# Patient Record
Sex: Male | Born: 1985 | Race: Black or African American | Hispanic: No | Marital: Single | Smoking: Never smoker
Health system: Southern US, Community
[De-identification: ages and names within clinical notes are randomized; demographics above are authoritative.]

---

## 2003-03-11 ENCOUNTER — Emergency Department (HOSPITAL_COMMUNITY): Admission: EM | Admit: 2003-03-11 | Discharge: 2003-03-12 | Payer: Self-pay | Admitting: Emergency Medicine

## 2003-03-21 ENCOUNTER — Emergency Department (HOSPITAL_COMMUNITY): Admission: EM | Admit: 2003-03-21 | Discharge: 2003-03-21 | Payer: Self-pay

## 2007-08-22 ENCOUNTER — Emergency Department (HOSPITAL_COMMUNITY): Admission: EM | Admit: 2007-08-22 | Discharge: 2007-08-22 | Payer: Self-pay | Admitting: Family Medicine

## 2012-11-13 ENCOUNTER — Encounter (HOSPITAL_COMMUNITY): Payer: Self-pay | Admitting: Emergency Medicine

## 2012-11-13 ENCOUNTER — Emergency Department (HOSPITAL_COMMUNITY)
Admission: EM | Admit: 2012-11-13 | Discharge: 2012-11-13 | Disposition: A | Discharge status: Home / Self Care | Payer: Commercial Managed Care - PPO | Attending: Emergency Medicine | Admitting: Emergency Medicine

## 2012-11-13 DIAGNOSIS — Z0000 Encounter for general adult medical examination without abnormal findings: Secondary | ICD-10-CM

## 2012-11-13 DIAGNOSIS — Z79899 Other long term (current) drug therapy: Secondary | ICD-10-CM | POA: Insufficient documentation

## 2012-11-13 NOTE — ED Notes (Signed)
Needs clearance note for physical for [employer]

## 2012-11-13 NOTE — ED Provider Notes (Signed)
CSN: [id number]     Arrival date & time 11/13/12  0043 History   First MD Initiated Contact with Patient 11/13/12 0104     Chief Complaint  Patient presents with  . medical evaluation    (Consider location/radiation/quality/duration/timing/severity/associated sxs/prior Treatment) HPI Patient presents for 3 employment screening physical exam. He has currently no complaints. He states he has no medical history. He takes no medications regularly. Denies any allergies. Denies any vision problems. He does have a family history of diabetes. He's been screen for position with the [employer].  History reviewed. No pertinent past medical history. History reviewed. No pertinent past surgical history. No family history on file. History  Substance Use Topics  . Smoking status: Never Smoker   . Smokeless tobacco: Not on file  . Alcohol Use: Yes     Comment: occ    Review of Systems  Constitutional: Negative for fever and chills.  Respiratory: Negative for cough and shortness of breath.   Cardiovascular: Negative for chest pain and palpitations.  Gastrointestinal: Negative for nausea, vomiting and abdominal pain.  Endocrine: Negative for polydipsia, polyphagia and polyuria.  Genitourinary: Negative for dysuria.  Musculoskeletal: Negative for back pain.  Skin: Negative for rash.  Neurological: Negative for dizziness, weakness, light-headedness, numbness and headaches.  All other systems reviewed and are negative.    Allergies  Review of patient's allergies indicates no known allergies.  Home Medications   Current Outpatient Rx  Name  Route  Sig  Dispense  Refill  . loratadine (CLARITIN) 10 MG tablet   Oral   Take 10 mg by mouth daily as needed for allergies.          BP 129/80  Pulse 79  Temp(Src) 98.3 F (36.8 C) (Oral)  Resp 22  Ht 6' (1.829 m)  Wt 240 lb (108.863 kg)  BMI 32.54 kg/m2  SpO2 100% Physical Exam  Nursing note and vitals  reviewed. Constitutional: He is oriented to person, place, and time. He appears well-developed and well-nourished. No distress.  HENT:  Head: Normocephalic and atraumatic.  Mouth/Throat: Oropharynx is clear and moist.  Eyes: EOM are normal. Pupils are equal, round, and reactive to light.  Neck: Normal range of motion. Neck supple.  Cardiovascular: Normal rate and regular rhythm.  Exam reveals no gallop and no friction rub.   No murmur heard. Pulmonary/Chest: Effort normal and breath sounds normal. No respiratory distress. He has no wheezes. He has no rales.  Abdominal: Soft. Bowel sounds are normal. He exhibits no distension and no mass. There is no tenderness. There is no rebound and no guarding.  Musculoskeletal: Normal range of motion. He exhibits no edema and no tenderness.  Neurological: He is alert and oriented to person, place, and time.  Patient is alert and oriented x3 with clear, goal oriented speech. Patient has 5/5 motor in all extremities. Sensation is intact to light touch. Patient has a normal gait and walks without assistance.   Skin: Skin is warm and dry. No rash noted. No erythema.  Psychiatric: He has a normal mood and affect. His behavior is normal.    ED Course  Procedures (including critical care time) Labs Review Labs Reviewed - No data to display Imaging Review No results found.  EKG Interpretation   None       MDM   1. Periodic health assessment, general screening, adult       Loren Racer, MD 11/13/12 [phone]

## 2012-11-13 NOTE — ED Notes (Signed)
Pt A&O no c/o. Discharged to home.

## 2015-01-10 ENCOUNTER — Ambulatory Visit
Admission: RE | Admit: 2015-01-10 | Discharge: 2015-01-10 | Disposition: A | Discharge status: Home / Self Care | Payer: No Typology Code available for payment source | Source: Ambulatory Visit | Attending: Occupational Medicine | Admitting: Occupational Medicine

## 2015-01-10 ENCOUNTER — Other Ambulatory Visit: Payer: Self-pay | Admitting: Occupational Medicine

## 2015-01-10 DIAGNOSIS — Z021 Encounter for pre-employment examination: Secondary | ICD-10-CM

## 2016-08-13 IMAGING — CR DG CHEST 1V
1 series · 1 of 1 positions shown · non-contrast
Comparison: None.

CLINICAL DATA: .-employment examination.

EXAM:
CHEST 1 VIEW

[w chest pa]
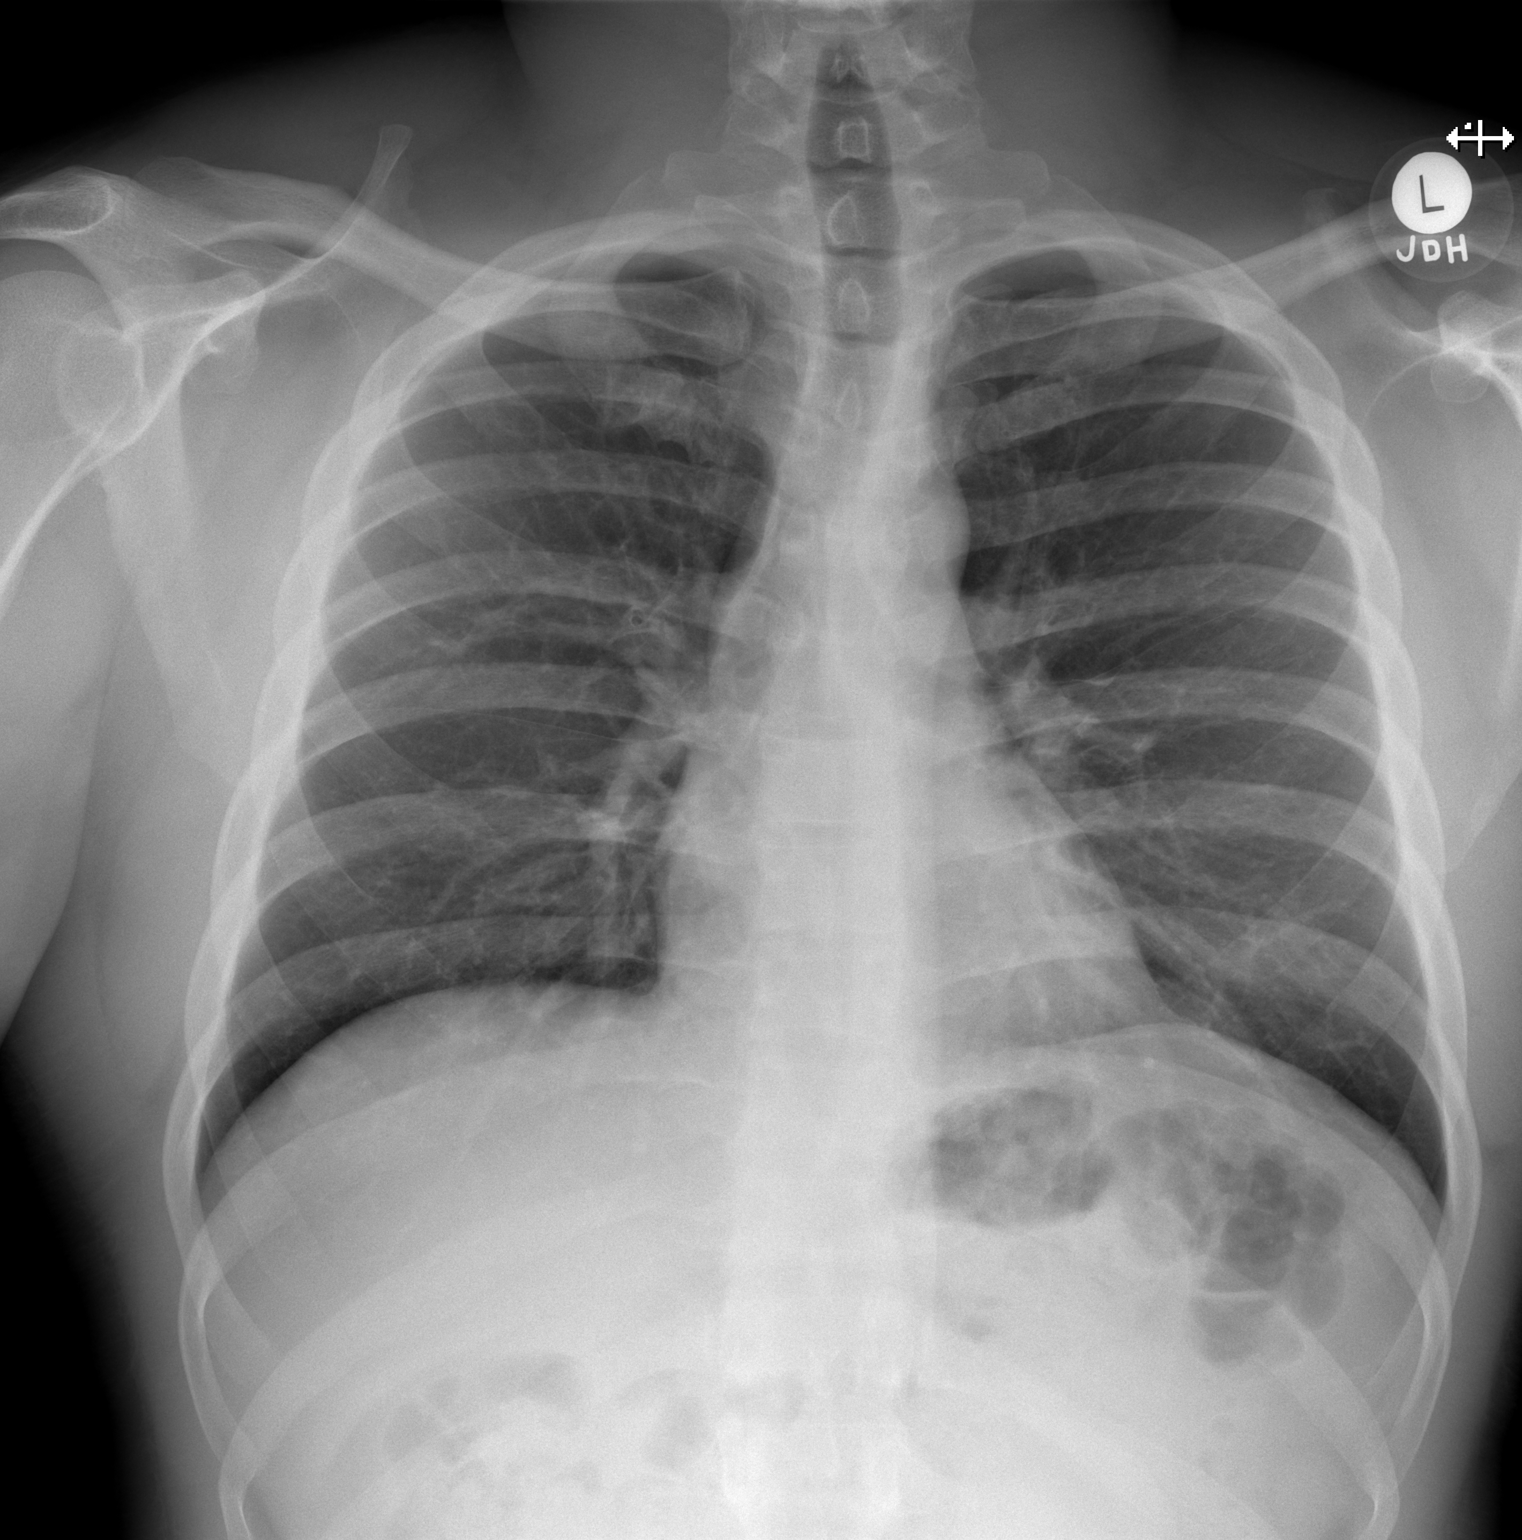

[1 of 1 positions shown; findings below may reference images not displayed]

FINDINGS: Normal heart size. Normal mediastinal contour. No pneumothorax. No
pleural effusion. Clear lungs, with no focal lung consolidation and
no pulmonary edema.
IMPRESSION: No active disease.

## 2021-11-09 ENCOUNTER — Ambulatory Visit: Payer: 59 | Admitting: Family Medicine

## 2022-06-17 ENCOUNTER — Emergency Department (HOSPITAL_COMMUNITY): Payer: 59

## 2022-06-17 ENCOUNTER — Emergency Department (HOSPITAL_COMMUNITY)
Admission: EM | Admit: 2022-06-17 | Discharge: 2022-06-17 | Disposition: A | Discharge status: Home / Self Care | Payer: 59 | Attending: Emergency Medicine | Admitting: Emergency Medicine

## 2022-06-17 ENCOUNTER — Encounter (HOSPITAL_COMMUNITY): Payer: Self-pay

## 2022-06-17 DIAGNOSIS — S39012A Strain of muscle, fascia and tendon of lower back, initial encounter: Secondary | ICD-10-CM | POA: Insufficient documentation

## 2022-06-17 DIAGNOSIS — Y990 Civilian activity done for income or pay: Secondary | ICD-10-CM | POA: Insufficient documentation

## 2022-06-17 DIAGNOSIS — X58XXXA Exposure to other specified factors, initial encounter: Secondary | ICD-10-CM | POA: Insufficient documentation

## 2022-06-17 DIAGNOSIS — M545 Low back pain, unspecified: Secondary | ICD-10-CM

## 2022-06-17 DIAGNOSIS — T148XXA Other injury of unspecified body region, initial encounter: Secondary | ICD-10-CM

## 2022-06-17 MED ORDER — PREDNISONE 20 MG PO TABS: 40.0000 mg | ORAL_TABLET | Freq: Every day | ORAL | 0 refills | Status: AC

## 2022-06-17 MED ORDER — METHOCARBAMOL 500 MG PO TABS: 500.0000 mg | ORAL_TABLET | Freq: Two times a day (BID) | ORAL | 0 refills | Status: AC

## 2022-06-17 NOTE — Discharge Instructions (Signed)
I have prescribed muscle relaxers for your pain, please do not drink or drive while taking this medications as it can make you drowsy.   I have also prescribed steroids, be aware this medication can cause insomnia, appetite changes.    Please follow-up with PCP in 1 week for reevaluation of your symptoms.If you experience any bowel or bladder incontinence, fever, worsening in your symptoms please return to the ED.  

## 2022-06-17 NOTE — ED Provider Notes (Signed)
Marco Lewis Provider Note   CSN: [id number] Arrival date & time: 06/17/22  1412     History  Chief Complaint  Patient presents with   Back Pain    Marco Lewis is a 37 y.o. male.  37 y.o male with a PMH presents to the ED with a chief complaint of back pain which has been ongoing for a few days.  Patient describes a dull aching pain to his midline lumbar spine, exacerbated with any type of movement.  He does report having an injury back in October, it states that he feels that he reinjured his back a few days ago while at work.  He does wear a work belt, however feels that this likely exacerbated his pain.  He did see an orthopedist a couple of days ago who prescribed him meloxicam which she has not picked up from the pharmacy.  He denies any bowel or bladder complaints, fever, history of cancer.  The history is provided by the patient and medical records.  Back Pain Location:  Lumbar spine Quality:  Aching Radiates to:  Does not radiate Associated symptoms: no fever        Home Medications Prior to Admission medications   Medication Sig Start Date End Date Taking? Authorizing Provider  methocarbamol (ROBAXIN) 500 MG tablet Take 1 tablet (500 mg total) by mouth 2 (two) times daily for 7 days. 06/17/22 06/24/22 Yes Allyn Bartelson, Leonie Douglas, PA-C  predniSONE (DELTASONE) 20 MG tablet Take 2 tablets (40 mg total) by mouth daily for 5 days. 06/17/22 06/22/22 Yes Mulki Roesler, Leonie Douglas, PA-C  loratadine (CLARITIN) 10 MG tablet Take 10 mg by mouth daily as needed for allergies.    [provider]      Allergies    Patient has no known allergies.    Review of Systems   Review of Systems  Constitutional:  Negative for fever.  Genitourinary:  Negative for difficulty urinating and penile discharge.  Musculoskeletal:  Positive for back pain.    Physical Exam Updated Vital Signs BP (!) 168/92 (BP Location: Left Arm)   Pulse (!) 107   Temp 97.8 F  (36.6 C) (Oral)   Resp 19   SpO2 97%  Physical Exam Vitals and nursing note reviewed.  Constitutional:      Appearance: Normal appearance.  HENT:     Head: Normocephalic and atraumatic.     Mouth/Throat:     Mouth: Mucous membranes are moist.  Cardiovascular:     Rate and Rhythm: Normal rate.  Pulmonary:     Effort: Pulmonary effort is normal.  Abdominal:     General: Abdomen is flat.  Musculoskeletal:        General: Signs of injury present.     Cervical back: Normal range of motion and neck supple.     Lumbar back: Tenderness present. Normal range of motion.     Comments: RLE- KF,KE 5/5 strength LLE- HF, HE 5/5 strength Normal gait. No pronator drift. No leg drop. . CN I, II and VIII not tested. CN II-XII grossly intact bilaterally.      Skin:    General: Skin is warm and dry.  Neurological:     Mental Status: He is alert and oriented to person, place, and time.     ED Results / Procedures / Treatments   Labs (all labs ordered are listed, but only abnormal results are displayed) Labs Reviewed - No data to display  EKG None  Radiology DG  Lumbar Spine Complete  Result Date: 06/17/2022 CLINICAL DATA:  back pain worsening EXAM: LUMBAR SPINE - COMPLETE 4+ VIEW COMPARISON:  March 21, 2003 FINDINGS: There are five non-rib bearing lumbar-type vertebral bodies. There is normal alignment. There is no evidence for acute fracture or subluxation. Intervertebral disc spaces are preserved with minimal endplate proliferative changes at L4. Visualized abdomen is unremarkable. 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 Electronically Signed   By: Meda Klinefelter M.D.   On: 06/17/2022 14:52    Procedures Procedures    Medications Ordered in ED Medications - No data to display  ED Course/ Medical Decision Making/ A&P                             Medical Decision Making Amount and/or Complexity of Data Reviewed Radiology: ordered.   Patient  presents to the ED with a chief complaint of back pain which has been ongoing for couple days.  Had a primary injury approximately in the month of October last year, reports he had x-rays after this and no acute finding.  On today's visit he reports midline lumbar spine tenderness exacerbated with movement.  He does work with involving labor, does wear a work brace which has not improved.  Did see Ortho who provided him with a prescription for meloxicam, he reports he has not picked this up from the pharmacy yet.  His exam is benign, he is without any red flags of cancer, fever, bowel or bladder complaints.  He is ambulatory in the ED with a steady gait.  X-ray obtained with minimal degenerative disc disease.  I did discuss with him supportive treatment with muscle relaxers, steroids.  His vitals are within normal limits.  We discussed further follow-up with his orthopedic surgeon.  Patient is agreeable to plan and treatment, return precautions discussed at length.    Portions of this note were generated with Scientist, clinical (histocompatibility and immunogenetics). Dictation errors may occur despite best attempts at proofreading.   Final Clinical Impression(s) / ED Diagnoses Final diagnoses:  Muscle strain  Acute midline low back pain without sciatica    Rx / DC Orders ED Discharge Orders          Ordered    predniSONE (DELTASONE) 20 MG tablet  Daily        06/17/22 1529    methocarbamol (ROBAXIN) 500 MG tablet  2 times daily        06/17/22 1529              Claude Manges, PA-C 06/17/22 1531    Long, Arlyss Repress, MD 06/26/22 [phone]

## 2022-06-17 NOTE — ED Triage Notes (Signed)
Pt presents with c/o back pain. Pt reports he had an injury to his back in October. Pt denies any new injury but reports it has been worse the last couple of days. Pt reports the pain is in his lower back. 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
